# Patient Record
Sex: Female | Born: 1995 | Race: White | Hispanic: No | Marital: Single | Smoking: Never smoker
Health system: Southern US, Community
[De-identification: ages and names within clinical notes are randomized; demographics above are authoritative.]

## PROBLEM LIST (undated history)

## (undated) DIAGNOSIS — K219 Gastro-esophageal reflux disease without esophagitis: Secondary | ICD-10-CM

## (undated) DIAGNOSIS — F411 Generalized anxiety disorder: Secondary | ICD-10-CM

## (undated) DIAGNOSIS — G4700 Insomnia, unspecified: Secondary | ICD-10-CM

## (undated) DIAGNOSIS — F32A Depression, unspecified: Secondary | ICD-10-CM

## (undated) HISTORY — DX: Depression, unspecified: F32.A

## (undated) HISTORY — DX: Gastro-esophageal reflux disease without esophagitis: K21.9

## (undated) HISTORY — DX: Insomnia, unspecified: G47.00

## (undated) HISTORY — DX: Generalized anxiety disorder: F41.1

---

## 2016-09-11 HISTORY — PX: ESOPHAGOGASTRODUODENOSCOPY: SHX1529

## 2017-08-09 ENCOUNTER — Encounter: Payer: Self-pay | Admitting: Gastroenterology

## 2017-08-10 ENCOUNTER — Ambulatory Visit (INDEPENDENT_AMBULATORY_CARE_PROVIDER_SITE_OTHER): Payer: BLUE CROSS/BLUE SHIELD | Admitting: Gastroenterology

## 2017-08-10 ENCOUNTER — Encounter: Payer: Self-pay | Admitting: Gastroenterology

## 2017-08-10 VITALS — BP 114/78 | HR 81 | Ht 59.0 in | Wt 84.0 lb

## 2017-08-10 DIAGNOSIS — R1310 Dysphagia, unspecified: Secondary | ICD-10-CM | POA: Diagnosis not present

## 2017-08-10 DIAGNOSIS — J029 Acute pharyngitis, unspecified: Secondary | ICD-10-CM | POA: Diagnosis not present

## 2017-08-10 MED ORDER — AZITHROMYCIN 250 MG PO TABS: ORAL_TABLET | ORAL | 0 refills | Status: DC

## 2017-08-10 NOTE — Progress Notes (Signed)
    Chief Complaint:   Referring Provider:  No ref. provider found      ASSESSMENT AND PLAN;   #1. Sore throat with odynophagia #2. ?  Esophageal dysphagia. #3.  Gastroesophageal reflux. - Z pak - If still with problems, proceed with barium swallow. -If still with problems, will consider repeating EGD.  May need ENT consultation if she continues to have problems. -Continue omeprazole 20 mg p.o. daily for now.   HPI:    Kelly Aguilar is a 22 y.o. female  Complains of throat hurting and painful swallowing. Occasional problems swallowing both liquids and solids. No recent antibiotics Has been having problems for week and a half. No significant fever or chills. No shortness of breath No cough Denies having any heartburn.  Has been on omeprazole 20 mg p.o. once a day.  Underwent EGD on 09/11/2016 Which showed mild gastritis, retained food.  It was otherwise normal.  Small bowel biopsies were negative for celiac disease and her CLOtest was neg. No skin rash, arthritis, dry mouth or eyes.   History reviewed. No pertinent past medical history.  Past Surgical History:  Procedure Laterality Date  . ESOPHAGOGASTRODUODENOSCOPY  09/11/2016   Mild gastritis. Retained foor ? importance.     History reviewed. No pertinent family history.  Social History   Tobacco Use  . Smoking status: Never Smoker  . Smokeless tobacco: Never Used  Substance Use Topics  . Alcohol use: Not Currently  . Drug use: Never    Current Outpatient Medications  Medication Sig Dispense Refill  . omeprazole (PRILOSEC) 20 MG capsule Take 20 mg by mouth daily.    . sertraline (ZOLOFT) 25 MG tablet Take 25 mg by mouth daily.     No current facility-administered medications for this visit.     Not on File  Review of Systems:  Constitutional: Denies fever, chills, diaphoresis, appetite change and fatigue.  HEENT: Denies photophobia, eye pain, redness, hearing loss, ear pain, congestion, sore throat,  rhinorrhea, sneezing, mouth sores, neck pain, neck stiffness and tinnitus.   Respiratory: Denies SOB, DOE, cough, chest tightness,  and wheezing.   Cardiovascular: Denies chest pain, palpitations and leg swelling.  Genitourinary: Denies dysuria, urgency, frequency, hematuria, flank pain and difficulty urinating.  Musculoskeletal: Denies myalgias, back pain, joint swelling, arthralgias and gait problem.  Skin: No rash.  Neurological: Denies dizziness, seizures, syncope, weakness, light-headedness, numbness and headaches.  Hematological: Denies adenopathy. Easy bruising, personal or family bleeding history  Psychiatric/Behavioral: Has anxiety or depression     Physical Exam:    BP 114/78   Pulse 81   Ht 4\' 11"  (1.499 m)   Wt 84 lb (38.1 kg)   BMI 16.97 kg/m  Filed Weights   08/10/17 1028  Weight: 84 lb (38.1 kg)   Constitutional:  Well-developed, in no acute distress. Psychiatric: Normal mood and affect. Behavior is normal. HEENT: Pupils normal.  Conjunctivae are normal. No scleral icterus. Neck supple.  Cardiovascular: Normal rate, regular rhythm. No edema Pulmonary/chest: Effort normal and breath sounds normal. No wheezing, rales or rhonchi. Abdominal: Soft, nondistended. Nontender. Bowel sounds active throughout. There are no masses palpable. No hepatomegaly. Rectal:  defered Neurological: Alert and oriented to person place and time. Skin: Skin is warm and dry. No rashes noted. Discussed with the patient's family   Edman Circleaj Toussaint Golson, MD 08/10/2017, 10:35 AM  Cc: No ref. provider found

## 2017-08-10 NOTE — Patient Instructions (Signed)
If you are age 21 or older, your body mass index should be between 23-30. Your Body mass index is 16.97 kg/m. If this is out of the aforementioned range listed, please consider follow up with your Primary Care Provider.  If you are age 21 or younger, your body mass index should be between 19-25. Your Body mass index is 16.97 kg/m. If this is out of the aformentioned range listed, please consider follow up with your Primary Care Provider.   You have been scheduled for a Barium Esophogram [facility] (1st floor of the hospital) on 09/07/17 at 9:30a,. Please arrive 15 minutes prior to your appointment for registration. Make certain not to have anything to eat or drink 6 hours prior to your test. If you need to reschedule for any reason, please contact radiology at [phone] to do so. __________________________________________________________________ A barium swallow is an examination that concentrates on views of the esophagus. This tends to be a double contrast exam (barium and two liquids which, when combined, create a gas to distend the wall of the oesophagus) or single contrast (non-ionic iodine based). The study is usually tailored to your symptoms so a good history is essential. Attention is paid during the study to the form, structure and configuration of the esophagus, looking for functional disorders (such as aspiration, dysphagia, achalasia, motility and reflux) EXAMINATION You may be asked to change into a gown, depending on the type of swallow being performed. A radiologist and radiographer will perform the procedure. The radiologist will advise you of the type of contrast selected for your procedure and direct you during the exam. You will be asked to stand, sit or lie in several different positions and to hold a small amount of fluid in your mouth before being asked to swallow while the imaging is performed .In some instances you may be asked to swallow barium coated marshmallows  to assess the motility of a solid food bolus. The exam can be recorded as a digital or video fluoroscopy procedure. POST PROCEDURE It will take 1-2 days for the barium to pass through your system. To facilitate this, it is important, unless otherwise directed, to increase your fluids for the next 24-48hrs and to resume your normal diet.  This test typically takes about 30 minutes to perform. ______________________________________________________________________________   We have sent the following medications to your pharmacy for you to pick up at your convenience: Z pack take as directed   Thank you,  Dr. Lynann Bolognaajesh Gupta

## 2017-09-04 ENCOUNTER — Telehealth: Payer: Self-pay | Admitting: Gastroenterology

## 2017-09-04 NOTE — Telephone Encounter (Signed)
Pt's mother Anette calling to r/s Barium swallow [facility]. Pls call her.

## 2017-09-05 NOTE — Telephone Encounter (Signed)
I spoke with her mother and gave her the number to call for x-ray scheduling and she can reschedule the barium swallow to a time that is most convenient.

## 2017-09-07 ENCOUNTER — Ambulatory Visit (HOSPITAL_COMMUNITY)
Admission: RE | Admit: 2017-09-07 | Discharge: 2017-09-07 | Disposition: A | Discharge status: Home / Self Care | Payer: BLUE CROSS/BLUE SHIELD | Source: Ambulatory Visit | Attending: Gastroenterology | Admitting: Gastroenterology

## 2017-09-07 DIAGNOSIS — J029 Acute pharyngitis, unspecified: Secondary | ICD-10-CM | POA: Diagnosis present

## 2017-09-07 DIAGNOSIS — R1310 Dysphagia, unspecified: Secondary | ICD-10-CM | POA: Insufficient documentation

## 2017-09-11 ENCOUNTER — Telehealth: Payer: Self-pay

## 2020-03-05 IMAGING — RF DG ESOPHAGUS
8 of 11 series · 12 of 24 positions shown · non-contrast
Comparison: None.

CLINICAL DATA: Worsening sore throat, odynophagia with liquids and
solids

EXAM:
ESOPHOGRAM/BARIUM SWALLOW
TECHNIQUE: Combined double contrast and single contrast examination performed
using effervescent crystals, thick barium liquid, and thin barium
liquid.
FLUOROSCOPY TIME:  Fluoroscopy Time:  2 minutes 12 seconds
Radiation Exposure Index (if provided by the fluoroscopic device):
9.5
Number of Acquired Spot Images: 12

[Series 1: cp_standard · 0.52mm/px · 1 of 65 frames shown (1 of 7)]
[frame 12/65]
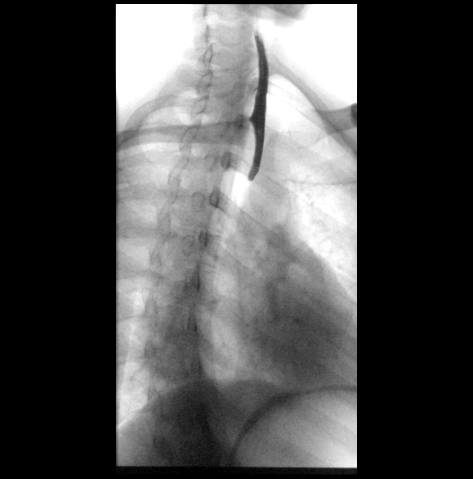

[Series 2: cp_standard · 0.52mm/px · 2 of 114 frames shown (2 of 7)]
[frame 6/114]
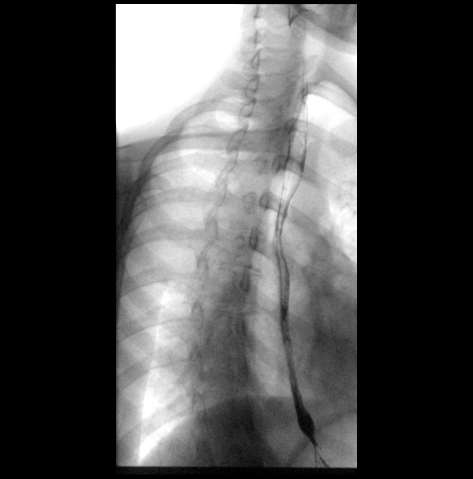
[frame 97/114]
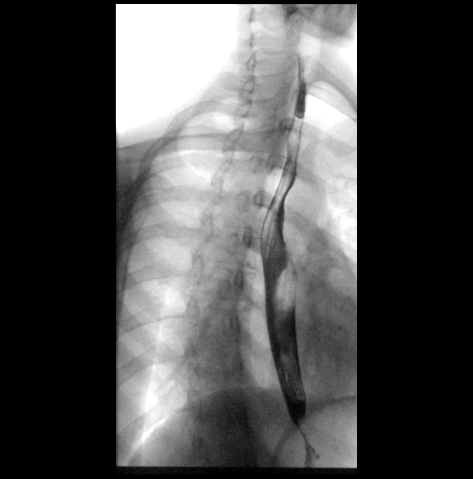

[Series 4: cp_standard · 0.35mm/px · 1 of 86 frames shown (3 of 7)]
[frame 44/86]
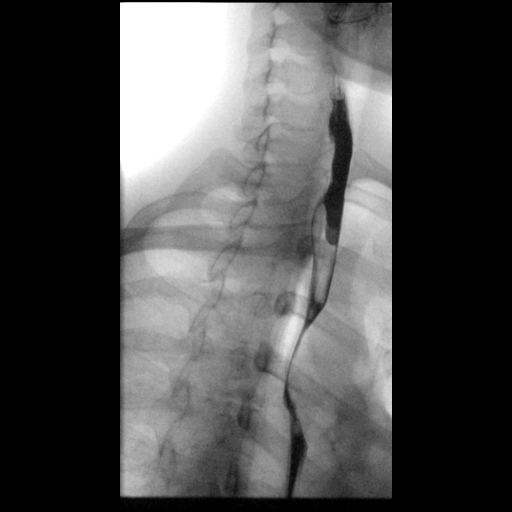

[Series 5: cp_standard · 0.35mm/px · 2 of 154 frames shown (4 of 7)]
[frame 24/154]
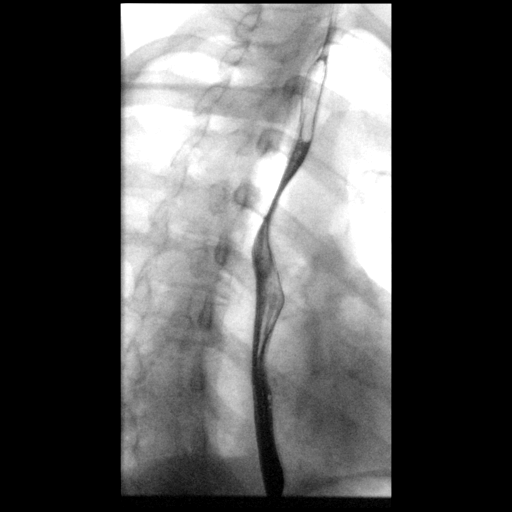
[frame 131/154]
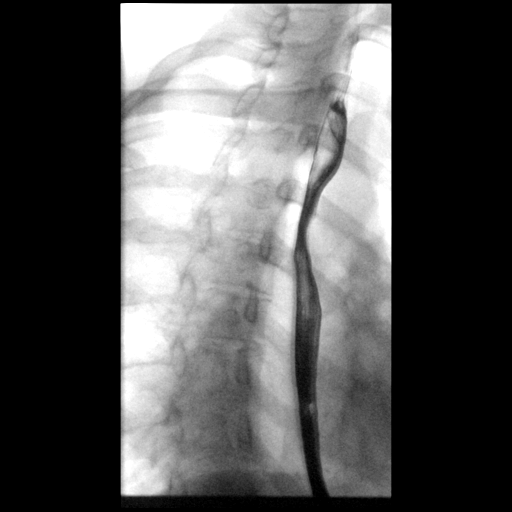

[Series 7: cp_standard · 0.35mm/px · 2 of 154 frames shown (5 of 7)]
[frame 24/154]
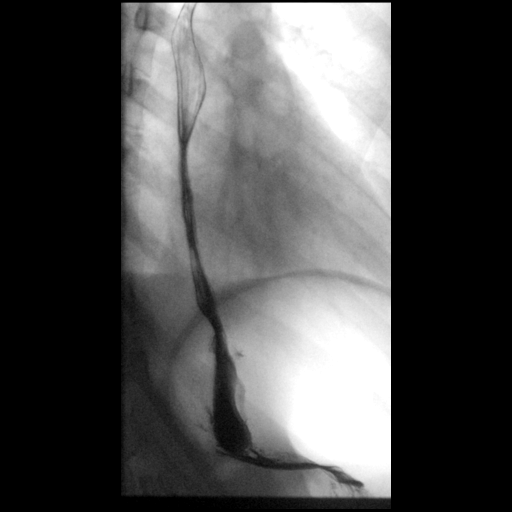
[frame 131/154]
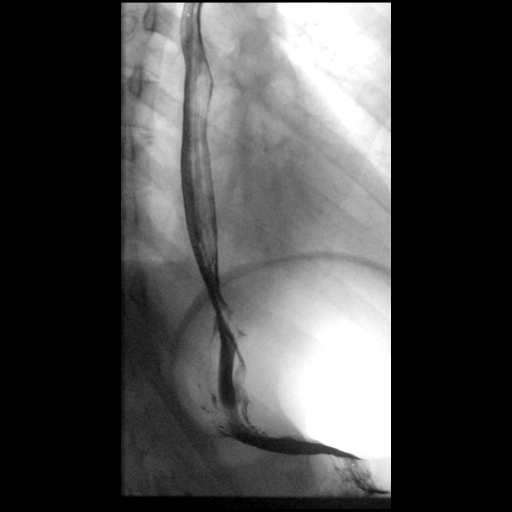

[Series 9: cp_standard · 0.37mm/px · 2 of 129 frames shown (6 of 7)]
[frame 13/129]
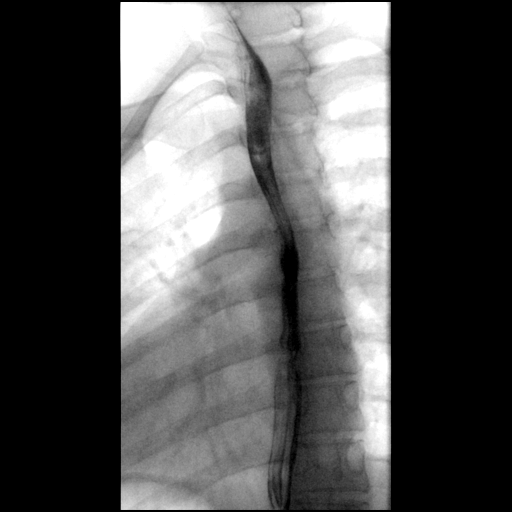
[frame 110/129]
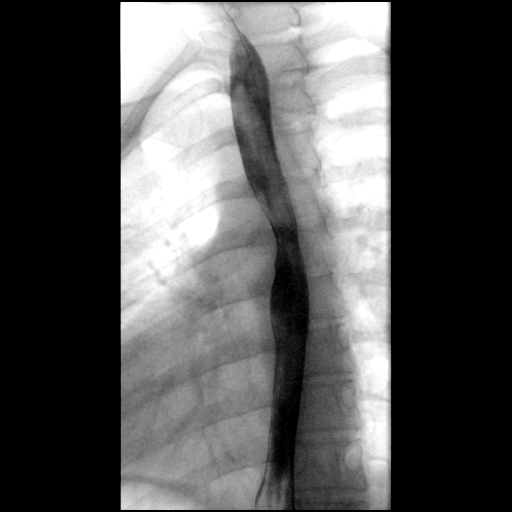

[Series 10: cp_standard · 0.37mm/px · 1 of 131 frames shown (7 of 7)]
[frame 66/131]
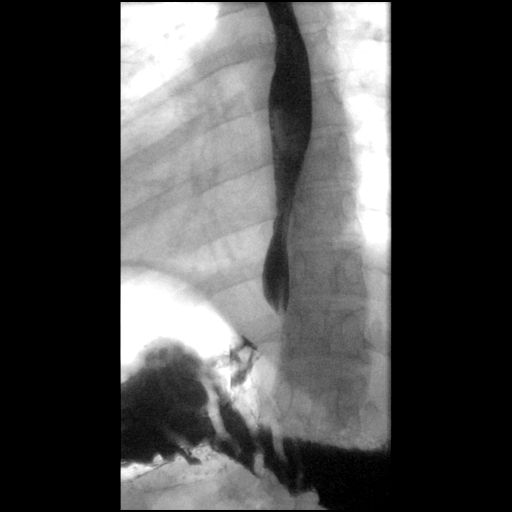

[Series 12: fluoro_barium 2fps_bw · 0.19mm/px · 1 of 1 slices shown]
[im 1/1]
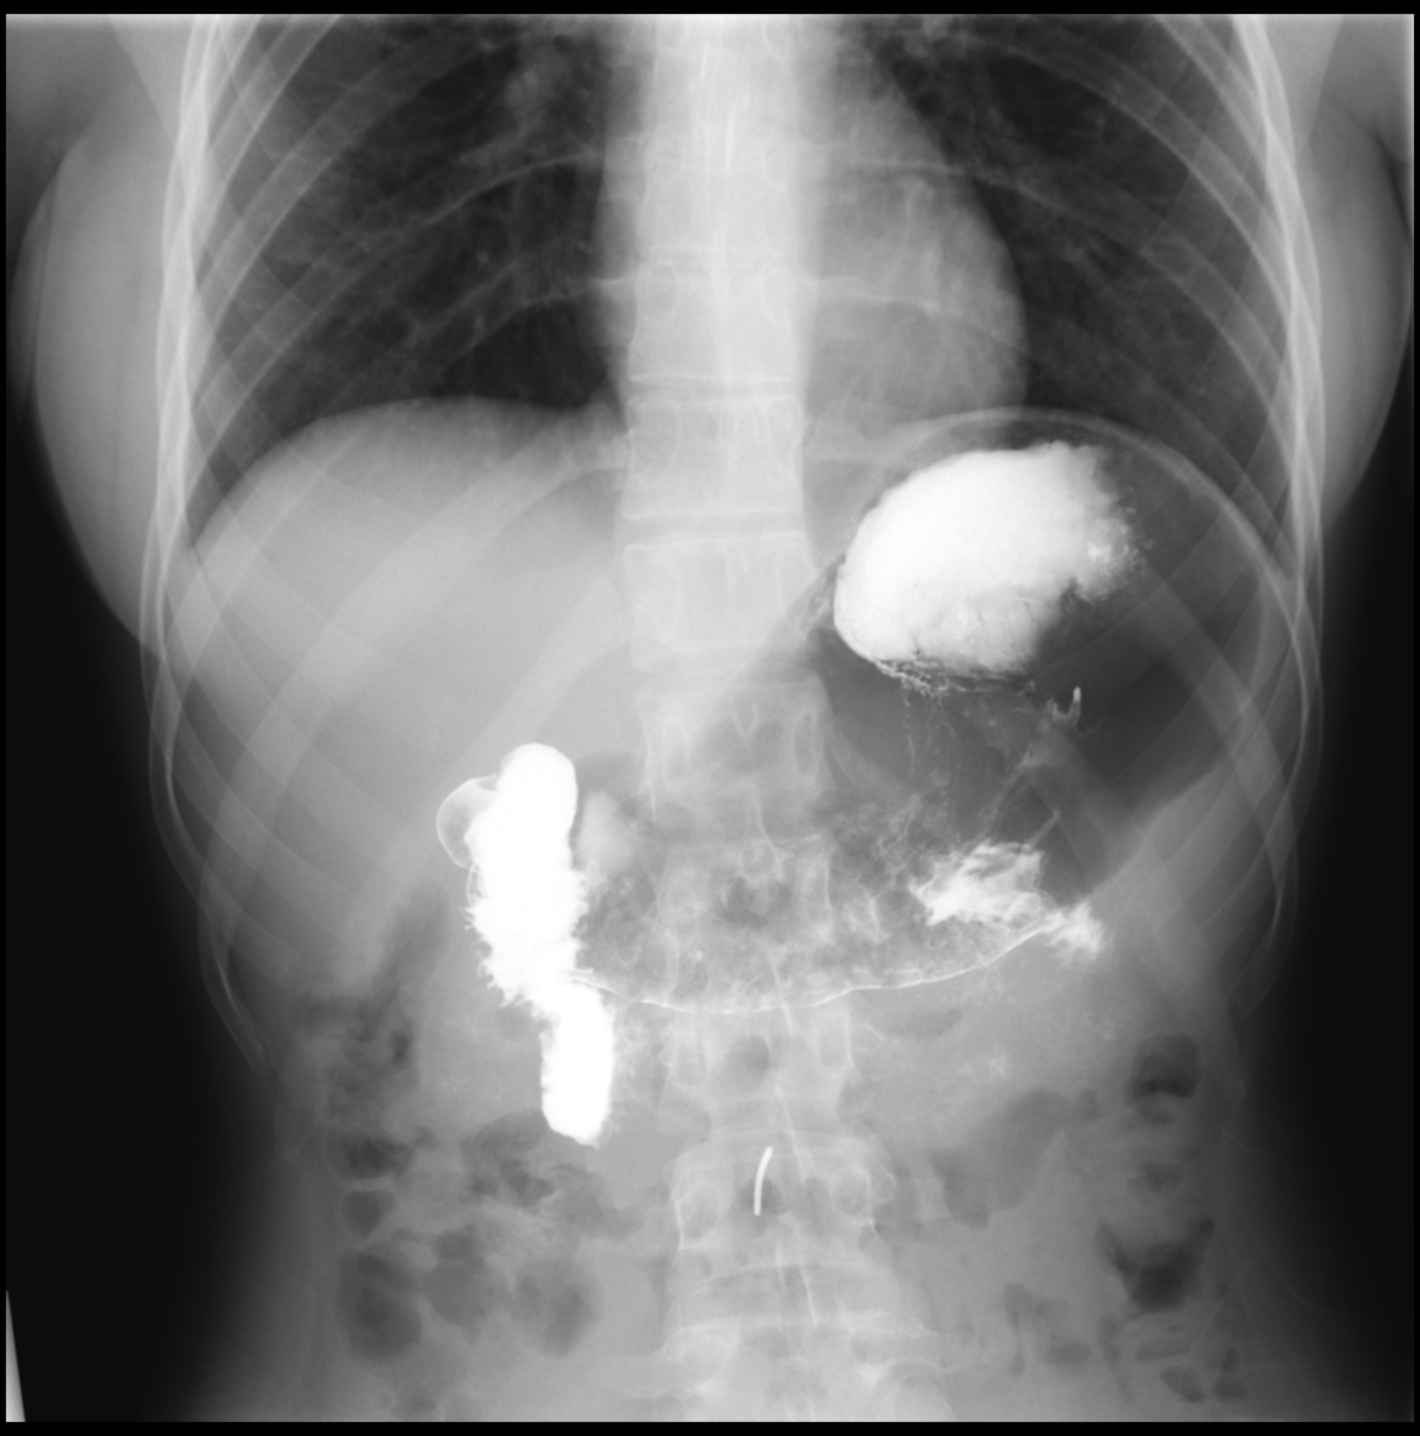

[12 of 24 positions shown; findings below may reference images not displayed]

FINDINGS: Patient was only able to tolerate very small sips of barium
contrast. As such, the study was limited.

Normal esophageal peristalsis. No fixed esophageal narrowing or
stricture.

A 13 mm barium tablet was not administered due to the patient's
anxiety.

No evidence of hiatal hernia.
IMPRESSION: Limited evaluation due to patient's ability to only tolerate small
sips of barium contrast.

Negative barium esophagram.

## 2023-04-06 ENCOUNTER — Ambulatory Visit: Payer: Self-pay | Admitting: Internal Medicine

## 2023-04-06 ENCOUNTER — Encounter: Payer: Self-pay | Admitting: Internal Medicine

## 2023-04-06 VITALS — BP 98/66 | HR 100 | Temp 98.4°F | Resp 16 | Ht 59.0 in | Wt 83.0 lb

## 2023-04-06 DIAGNOSIS — K219 Gastro-esophageal reflux disease without esophagitis: Secondary | ICD-10-CM

## 2023-04-06 DIAGNOSIS — F411 Generalized anxiety disorder: Secondary | ICD-10-CM

## 2023-04-06 MED ORDER — HYDROXYZINE HCL 25 MG PO TABS: 25.0000 mg | ORAL_TABLET | Freq: Every day | ORAL | 2 refills | Status: DC | PRN

## 2023-04-06 MED ORDER — ESCITALOPRAM OXALATE 5 MG PO TABS: 5.0000 mg | ORAL_TABLET | Freq: Every day | ORAL | 2 refills | Status: DC

## 2023-04-06 MED ORDER — FAMOTIDINE 20 MG PO TABS: 20.0000 mg | ORAL_TABLET | Freq: Two times a day (BID) | ORAL | 2 refills | Status: AC

## 2023-04-06 NOTE — Assessment & Plan Note (Signed)
 She is having GAD with panic attacks.  I am going to start her on lexapro 5mg  daily which she was on in the past.  We discussed xanax and hydroxyzine and she would like to take hydroxyzine as needed for panic attacks.

## 2023-04-06 NOTE — Assessment & Plan Note (Signed)
 She states that pepcid has worked in the past.  We will start her on pepcid 20mg  BID.  IF this is not working over the next few weeks to a month, she can call back and we will start on protonix or nexium.

## 2023-04-06 NOTE — Progress Notes (Addendum)
 Office Visit  Subjective   Patient ID: Kelly Aguilar   DOB: 04/09/1995   Age: 28 y.o.   MRN: [id number]   Chief Complaint Chief Complaint  Patient presents with   Acute Visit    Wants to talk about starting medication back     History of Present Illness Kelly Aguilar is a 28 yo female who comes in today to discuss her anxiety.  She has had a history of depression in the past but denies any problems with this today.  She states she is having increasing anxiousness where she was on lexapro in the past.  This did help her but she quit it maybe 2 years ago.  She states she gets anxious with being in the car, storms, loud noises and she has 2-3 panic attacks per week.  She has a history of insomnia which she was a young child and she states she has problems with getting to sleep and staying asleep.  Her mind used to race but she states she now has feelings of her heart racing.  She is sleeping 3-5 hrs per night.  She has been on zoloft in the past and this  made her feel full. She does have problems with concentration and fatigue and loss of appeite.  She denies any extreme feelings of guilt, feelings of isolation, feelings of worthlessness, helpless feeling, social withdrawal or loss of pleasurable activities.  She has no suicidal ideation or homicidal ideation. This patient feels that she is able to care for herself. She has no predisposing factors for depression or anxiety. She denies a recent life crisis. She currently lives with her parents and boyfriend.  She has never seen a Copywriter, advertising.   The patient also states she has a history of GERD which she states started 6-7 years ago.  She will get burning in her throat and and epigastric pain.  There is episodes of nausea and vomiting.  She has been on both pepcid and omeprazole in the past.  Spicy foods worsen her reflux.  She is requesting to restart a reflux medicine today.  She used to be on omeprazole 20mg  once a day.       Past Medical History Past Medical History:  Diagnosis Date   Depression    GAD (generalized anxiety disorder)    GERD (gastroesophageal reflux disease)    Insomnia      Allergies Allergies  Allergen Reactions   Grapeseed Oil [Proanthocyanidin]    Peanut-Containing Drug Products    Strawberry (Diagnostic)      Medications  Current Outpatient Medications:    escitalopram (LEXAPRO) 5 MG tablet, Take 1 tablet (5 mg total) by mouth daily., Disp: 30 tablet, Rfl: 2   famotidine (PEPCID) 20 MG tablet, Take 1 tablet (20 mg total) by mouth 2 (two) times daily., Disp: 60 tablet, Rfl: 2   hydrOXYzine (ATARAX) 25 MG tablet, Take 1 tablet (25 mg total) by mouth daily as needed., Disp: 30 tablet, Rfl: 2   Review of Systems Review of Systems  Constitutional:  Negative for chills and fever.  Eyes:  Negative for blurred vision and double vision.  Respiratory:  Negative for cough, shortness of breath and wheezing.   Cardiovascular:  Negative for chest pain, palpitations and leg swelling.  Gastrointestinal:  Positive for abdominal pain, heartburn, nausea and vomiting. Negative for blood in stool, constipation, diarrhea and melena.  Neurological:  Positive for dizziness. Negative for weakness and headaches.  Psychiatric/Behavioral:  Negative for depression, substance  abuse and suicidal ideas. The patient is nervous/anxious and has insomnia.        Objective:    Vitals BP 98/66   Pulse 100   Temp 98.4 F (36.9 C)   Resp 16   Ht 4\' 11"  (1.499 m)   Wt 83 lb (37.6 kg)   SpO2 99%   BMI 16.76 kg/m    Physical Examination Physical Exam Constitutional:      Appearance: Normal appearance. She is not ill-appearing.  Cardiovascular:     Rate and Rhythm: Normal rate and regular rhythm.     Pulses: Normal pulses.     Heart sounds: No murmur heard.    No friction rub. No gallop.  Pulmonary:     Effort: Pulmonary effort is normal. No respiratory distress.     Breath sounds: No wheezing,  rhonchi or rales.  Abdominal:     General: Bowel sounds are normal. There is no distension.     Palpations: Abdomen is soft.     Tenderness: There is no abdominal tenderness.  Musculoskeletal:     Right lower leg: No edema.     Left lower leg: No edema.  Skin:    General: Skin is warm and dry.     Findings: No rash.  Neurological:     Mental Status: She is alert.        Assessment & Plan:   Gastroesophageal reflux disease She states that pepcid has worked in the past.  We will start her on pepcid 20mg  BID.  IF this is not working over the next few weeks to a month, she can call back and we will start on protonix or nexium.  GAD (generalized anxiety disorder) She is having GAD with panic attacks.  I am going to start her on lexapro 5mg  daily which she was on in the past.  We discussed xanax and hydroxyzine and she would like to take hydroxyzine as needed for panic attacks.    Return in about 3 months (around 07/07/2023).   Crist Fat, MD

## 2023-07-09 ENCOUNTER — Ambulatory Visit: Payer: Self-pay | Admitting: Internal Medicine

## 2023-07-17 ENCOUNTER — Ambulatory Visit: Payer: Self-pay | Admitting: Internal Medicine

## 2023-07-23 ENCOUNTER — Ambulatory Visit: Payer: Self-pay | Admitting: Internal Medicine

## 2023-07-27 ENCOUNTER — Ambulatory Visit: Payer: Self-pay | Admitting: Internal Medicine

## 2023-07-27 ENCOUNTER — Encounter: Payer: Self-pay | Admitting: Internal Medicine

## 2023-07-27 VITALS — BP 102/60 | HR 72 | Temp 98.6°F | Resp 16 | Ht 59.0 in | Wt 91.2 lb

## 2023-07-27 DIAGNOSIS — F411 Generalized anxiety disorder: Secondary | ICD-10-CM

## 2023-07-27 DIAGNOSIS — F410 Panic disorder [episodic paroxysmal anxiety] without agoraphobia: Secondary | ICD-10-CM | POA: Insufficient documentation

## 2023-07-27 MED ORDER — ESCITALOPRAM OXALATE 5 MG PO TABS: 5.0000 mg | ORAL_TABLET | Freq: Every day | ORAL | 1 refills | Status: DC

## 2023-07-27 NOTE — Assessment & Plan Note (Signed)
 Plan as above.

## 2023-07-27 NOTE — Assessment & Plan Note (Signed)
 Her anxiety is now doing well and is controlled on lexapro .  She can take hydroxyzine  as needed for any panic attacks for difficulty sleeping.  I will see her back in 3 months for a yearly exam.

## 2023-07-27 NOTE — Progress Notes (Signed)
 Office Visit  Subjective   Patient ID: Sanjna Haskew   DOB: [birth year]   Age: 28 y.o.   MRN: [id number]   Chief Complaint Chief Complaint  Patient presents with   Follow-up     History of Present Illness Mrs. Breitenstein is a 28 yo female who returns today for her generalized anxiety with panic disorder.  She has had a history of depression in the past but denies continues to have no problems with this today.   I saw her 3 months ago where she was having increasing anxiousness with 2-3 panic attacks per week.  She had been on lexapro  in the past and on her last visit we restarted lexapro  and placed her on lexapro  5 mg daily.  Today, she states her GAD is mild and controlled.  She denies any panic attacks at this time and has not had a panic attack since starting lexapro .   She states she gets anxious with being in the car, storms, loud noises.  She has a history of insomnia which she was a young child and she states she has problems with getting to sleep and staying asleep.  Her mind used to race but she states she now has feelings of her heart racing.  She is sleeping 3-5 hrs per night. I did start her on hydroxyzine  prn but I also told her she could use this for sleep and she states it is helping her insomnia. She has been on zoloft in the past and this  made her feel full. She does have problems with concentration and fatigue and loss of appeite.  She denies any extreme feelings of guilt, feelings of isolation, feelings of worthlessness, helpless feeling, social withdrawal or loss of pleasurable activities.  She has no suicidal ideation or homicidal ideation. This patient feels that she is able to care for herself. She has no predisposing factors for depression or anxiety. She denies a recent life crisis. She currently lives with her parents and boyfriend.  She has never seen a Copywriter, advertising.     Past Medical History Past Medical History:  Diagnosis Date   Depression    GAD  (generalized anxiety disorder)    GERD (gastroesophageal reflux disease)    Insomnia      Allergies Allergies  Allergen Reactions   Grapeseed Oil [Proanthocyanidin]    Peanut-Containing Drug Products    Strawberry (Diagnostic)      Medications  Current Outpatient Medications:    escitalopram  (LEXAPRO ) 5 MG tablet, Take 1 tablet (5 mg total) by mouth daily., Disp: 30 tablet, Rfl: 2   famotidine  (PEPCID ) 20 MG tablet, Take 1 tablet (20 mg total) by mouth 2 (two) times daily., Disp: 60 tablet, Rfl: 2   hydrOXYzine  (ATARAX ) 25 MG tablet, Take 1 tablet (25 mg total) by mouth daily as needed., Disp: 30 tablet, Rfl: 2   Review of Systems Review of Systems  Constitutional:  Negative for chills and fever.  Eyes:  Negative for blurred vision.  Respiratory:  Negative for shortness of breath.   Cardiovascular:  Negative for chest pain and palpitations.  Gastrointestinal:  Negative for abdominal pain, constipation, diarrhea, nausea and vomiting.  Musculoskeletal:  Negative for myalgias.  Neurological:  Negative for dizziness, weakness and headaches.       Objective:    Vitals BP 102/60   Pulse 72   Temp 98.6 F (37 C)   Resp 16   Ht 4' 11 (1.499 m)   Wt 91 lb 3.2  oz (41.4 kg)   SpO2 99%   BMI 18.42 kg/m    Physical Examination Physical Exam Constitutional:      Appearance: Normal appearance. She is not ill-appearing.  Cardiovascular:     Rate and Rhythm: Normal rate and regular rhythm.     Pulses: Normal pulses.     Heart sounds: No murmur heard.    No friction rub. No gallop.  Pulmonary:     Effort: Pulmonary effort is normal. No respiratory distress.     Breath sounds: No wheezing, rhonchi or rales.  Abdominal:     General: Bowel sounds are normal. There is no distension.     Palpations: Abdomen is soft.     Tenderness: There is no abdominal tenderness.  Musculoskeletal:     Right lower leg: No edema.     Left lower leg: No edema.  Skin:    General: Skin is  warm and dry.     Findings: No rash.  Neurological:     Mental Status: She is alert.        Assessment & Plan:   GAD (generalized anxiety disorder) Her anxiety is now doing well and is controlled on lexapro .  She can take hydroxyzine  as needed for any panic attacks for difficulty sleeping.  I will see her back in 3 months for a yearly exam.  Panic disorder Plan as above.    Return in about 3 months (around 10/27/2023).   Selinda Fleeta Finger, MD

## 2023-10-26 ENCOUNTER — Ambulatory Visit: Payer: Self-pay | Admitting: Internal Medicine

## 2023-10-29 ENCOUNTER — Ambulatory Visit: Payer: Self-pay | Admitting: Internal Medicine

## 2023-11-01 ENCOUNTER — Encounter: Payer: Self-pay | Admitting: Internal Medicine

## 2023-11-01 ENCOUNTER — Ambulatory Visit: Payer: Self-pay | Admitting: Internal Medicine

## 2023-11-01 VITALS — BP 102/80 | HR 94 | Temp 97.9°F | Resp 16 | Ht 59.0 in | Wt 91.2 lb

## 2023-11-01 DIAGNOSIS — F410 Panic disorder [episodic paroxysmal anxiety] without agoraphobia: Secondary | ICD-10-CM

## 2023-11-01 DIAGNOSIS — Z0000 Encounter for general adult medical examination without abnormal findings: Secondary | ICD-10-CM

## 2023-11-01 DIAGNOSIS — F411 Generalized anxiety disorder: Secondary | ICD-10-CM

## 2023-11-01 DIAGNOSIS — K219 Gastro-esophageal reflux disease without esophagitis: Secondary | ICD-10-CM

## 2023-11-01 DIAGNOSIS — Z681 Body mass index (BMI) 19 or less, adult: Secondary | ICD-10-CM

## 2023-11-01 MED ORDER — HYDROXYZINE HCL 25 MG PO TABS: 25.0000 mg | ORAL_TABLET | Freq: Every day | ORAL | 2 refills | Status: AC | PRN

## 2023-11-01 MED ORDER — ESCITALOPRAM OXALATE 5 MG PO TABS
5.0000 mg | ORAL_TABLET | Freq: Every day | ORAL | 1 refills | Status: AC
Start: 2023-11-01 — End: ?

## 2023-11-01 NOTE — Assessment & Plan Note (Signed)
 Her reflux is controlled on pepcid .  We will continue to monitor.

## 2023-11-01 NOTE — Assessment & Plan Note (Addendum)
 Health maintenance was discussed.  We will obtain some yearly labs.  Continue with healthy eating and routine exercise.

## 2023-11-01 NOTE — Assessment & Plan Note (Signed)
 Her anxiety and panic disorder is controlled on lexapro .  She will use hydroxyzine  as needed for sleep problems.  She is not really having any panic attacks at this time.

## 2023-11-01 NOTE — Progress Notes (Signed)
 Office Visit  Subjective   Patient ID: Kelly Aguilar   DOB: [birth year]   Age: 28 y.o.   MRN: [id number]   Chief Complaint Chief Complaint  Patient presents with   Follow-up    3 Month follow up     History of Present Illness Deone Omahoney is a 28 year old Caucasian/White female who presents for her annual health maintenance exam. She is due for the following health maintenance studies: screening labs. This patient's past medical history Anxiety Disorder, Generalized, GERD (gastroesophageal reflux disease), and Insomnia.    Her last eye exam was done in 12/2022 and she states her vision is doing well. She does wear eye glasses.  There is no family history of colorectal, breast or ovarian/uterine cancer.  She has a history of GERD where she underwent an EGD on 09/11/2016 which showed mild gastritis, some retained food and otherwise was normal.  I saw her this past year where we placed her on pepcid  and she states this does control her reflux.  The patient does exercise by going to the gym.  The patient does not smoke or vape.  She does not get yearly flu vaccines.  She has had 2 COVID-19 vaccines in the past.  There is no family history of CAD or strokes.  The patient is a 28 yo female who returns today for her generalized anxiety with panic disorder.  She has had a history of depression in the past but denies any problems with this today.  Today, she states her anxiety is mild and controlled.  Earlier this year, she was having increasing anxiousness with 2-3 panic attacks per week.  She now states she is not having any panic attacks.   She had been on lexapro  in the past and on her last visit we restarted lexapro  and placed her on lexapro  5 mg daily.  he states she gets anxious with being in the car, storms, loud noises.  She has a history of insomnia which she was a young child and she states she has problems with getting to sleep and staying asleep.  Her mind used to race but she states  she now has feelings of her heart racing.  She is sleeping 3-5 hrs per night. I did start her on hydroxyzine  prn but I also told her she could use this for sleep and she states it is helping her insomnia. She has been on zoloft in the past and this  made her feel full. She does have problems with concentration and fatigue and loss of appeite.  She denies any extreme feelings of guilt, feelings of isolation, feelings of worthlessness, helpless feeling, social withdrawal or loss of pleasurable activities.  She has no suicidal ideation or homicidal ideation. This patient feels that she is able to care for herself. She has no predisposing factors for depression or anxiety. She denies a recent life crisis. She currently lives with her parents and boyfriend.  She has never seen a Copywriter, advertising.     Past Medical History Past Medical History:  Diagnosis Date   Depression    GAD (generalized anxiety disorder)    GERD (gastroesophageal reflux disease)    Insomnia      Allergies Allergies  Allergen Reactions   Grapeseed Oil [Proanthocyanidin]    Peanut-Containing Drug Products    Strawberry (Diagnostic)      Medications  Current Outpatient Medications:    escitalopram  (LEXAPRO ) 5 MG tablet, Take 1 tablet (5 mg total) by  mouth daily., Disp: 90 tablet, Rfl: 1   famotidine  (PEPCID ) 20 MG tablet, Take 1 tablet (20 mg total) by mouth 2 (two) times daily., Disp: 60 tablet, Rfl: 2   hydrOXYzine  (ATARAX ) 25 MG tablet, Take 1 tablet (25 mg total) by mouth daily as needed., Disp: 30 tablet, Rfl: 2   Review of Systems Review of Systems  Constitutional:  Negative for chills, fever and malaise/fatigue.  Eyes:  Negative for blurred vision and double vision.  Respiratory:  Negative for cough, hemoptysis, shortness of breath and wheezing.   Cardiovascular:  Negative for chest pain, palpitations and leg swelling.  Gastrointestinal:  Negative for abdominal pain, blood in stool, constipation,  diarrhea, heartburn, melena, nausea and vomiting.  Genitourinary:  Negative for frequency and hematuria.  Musculoskeletal:  Negative for myalgias.  Skin:  Negative for itching and rash.  Neurological:  Negative for dizziness, weakness and headaches.  Endo/Heme/Allergies:  Negative for polydipsia.       Objective:    Vitals BP 102/80   Pulse 94   Temp 97.9 F (36.6 C) (Temporal)   Resp 16   Ht 4' 11 (1.499 m)   Wt 91 lb 3.2 oz (41.4 kg)   LMP  (LMP Unknown)   SpO2 99%   BMI 18.42 kg/m    Physical Examination Physical Exam Constitutional:      Appearance: Normal appearance. She is not ill-appearing.  HENT:     Head: Normocephalic and atraumatic.     Right Ear: Tympanic membrane, ear canal and external ear normal.     Left Ear: Tympanic membrane, ear canal and external ear normal.     Nose: Nose normal. No congestion or rhinorrhea.     Mouth/Throat:     Mouth: Mucous membranes are moist.     Pharynx: Oropharynx is clear. No oropharyngeal exudate or posterior oropharyngeal erythema.  Eyes:     General: No scleral icterus.    Conjunctiva/sclera: Conjunctivae normal.     Pupils: Pupils are equal, round, and reactive to light.  Neck:     Vascular: No carotid bruit.  Cardiovascular:     Rate and Rhythm: Normal rate and regular rhythm.     Pulses: Normal pulses.     Heart sounds: No murmur heard.    No friction rub. No gallop.  Pulmonary:     Effort: Pulmonary effort is normal. No respiratory distress.     Breath sounds: No wheezing, rhonchi or rales.  Abdominal:     General: Bowel sounds are normal. There is no distension.     Palpations: Abdomen is soft.     Tenderness: There is no abdominal tenderness.  Musculoskeletal:     Cervical back: Neck supple. No tenderness.     Right lower leg: No edema.     Left lower leg: No edema.  Lymphadenopathy:     Cervical: No cervical adenopathy.  Skin:    General: Skin is warm and dry.     Findings: No rash.  Neurological:      General: No focal deficit present.     Mental Status: She is alert and oriented to person, place, and time.  Psychiatric:        Mood and Affect: Mood normal.        Behavior: Behavior normal.        Assessment & Plan:   Gastroesophageal reflux disease Her reflux is controlled on pepcid .  We will continue to monitor.  Annual physical exam Health maintenance was discussed.  We  will obtain some yearly labs.  Continue with healthy eating and routine exercise.  GAD (generalized anxiety disorder) Her anxiety and panic disorder is controlled on lexapro .  She will use hydroxyzine  as needed for sleep problems.  She is not really having any panic attacks at this time.  Panic disorder As above.    Return in about 6 months (around 05/01/2024).   Selinda Fleeta Finger, MD

## 2023-11-01 NOTE — Assessment & Plan Note (Signed)
 As above

## 2023-11-02 LAB — CBC WITH DIFFERENTIAL/PLATELET
Basophils Absolute: 0 x10E3/uL (ref 0.0–0.2)
Basos: 1 %
EOS (ABSOLUTE): 0.1 x10E3/uL (ref 0.0–0.4)
Eos: 2 %
Hematocrit: 44.5 % (ref 34.0–46.6)
Hemoglobin: 14.6 g/dL (ref 11.1–15.9)
Immature Grans (Abs): 0 x10E3/uL (ref 0.0–0.1)
Immature Granulocytes: 0 %
Lymphocytes Absolute: 1 x10E3/uL (ref 0.7–3.1)
Lymphs: 26 %
MCH: 31.5 pg (ref 26.6–33.0)
MCHC: 32.8 g/dL (ref 31.5–35.7)
MCV: 96 fL (ref 79–97)
Monocytes Absolute: 0.2 x10E3/uL (ref 0.1–0.9)
Monocytes: 5 %
Neutrophils Absolute: 2.5 x10E3/uL (ref 1.4–7.0)
Neutrophils: 66 %
Platelets: 268 x10E3/uL (ref 150–450)
RBC: 4.64 x10E6/uL (ref 3.77–5.28)
RDW: 12.1 % (ref 11.7–15.4)
WBC: 3.8 x10E3/uL (ref 3.4–10.8)

## 2023-11-02 LAB — LIPID PANEL
Chol/HDL Ratio: 3.2 ratio (ref 0.0–4.4)
Cholesterol, Total: 181 mg/dL (ref 100–199)
HDL: 57 mg/dL (ref >39)
LDL Chol Calc (NIH): 111 mg/dL — ABNORMAL HIGH (ref 0–99)
Triglycerides: 67 mg/dL (ref 0–149)
VLDL Cholesterol Cal: 13 mg/dL (ref 5–40)

## 2023-11-02 LAB — CMP14 + ANION GAP
ALT: 9 IU/L (ref 0–32)
AST: 17 IU/L (ref 0–40)
Albumin: 4.7 g/dL (ref 4.0–5.0)
Alkaline Phosphatase: 85 IU/L (ref 41–116)
Anion Gap: 14 mmol/L (ref 10.0–18.0)
BUN/Creatinine Ratio: 8 — ABNORMAL LOW (ref 9–23)
BUN: 7 mg/dL (ref 6–20)
Bilirubin Total: 0.5 mg/dL (ref 0.0–1.2)
CO2: 24 mmol/L (ref 20–29)
Calcium: 9.4 mg/dL (ref 8.7–10.2)
Chloride: 105 mmol/L (ref 96–106)
Creatinine, Ser: 0.85 mg/dL (ref 0.57–1.00)
Globulin, Total: 2 g/dL (ref 1.5–4.5)
Glucose: 99 mg/dL (ref 70–99)
Potassium: 4.6 mmol/L (ref 3.5–5.2)
Sodium: 143 mmol/L (ref 134–144)
Total Protein: 6.7 g/dL (ref 6.0–8.5)
eGFR: 96 mL/min/1.73 (ref >59)

## 2023-11-02 LAB — HEMOGLOBIN A1C
Est. average glucose Bld gHb Est-mCnc: 97 mg/dL
Hgb A1c MFr Bld: 5 % (ref 4.8–5.6)

## 2023-11-02 LAB — TSH: TSH: 3.1 u[IU]/mL (ref 0.450–4.500)

## 2023-12-10 ENCOUNTER — Ambulatory Visit: Payer: Self-pay

## 2023-12-10 NOTE — Progress Notes (Signed)
 Patient called.  Patient aware.  Per Dr. Fleeta Finger Her yearly labs look good. Kelly Aguilar

## 2024-04-30 ENCOUNTER — Ambulatory Visit: Payer: Self-pay | Admitting: Internal Medicine
